# Patient Record
Sex: Female | Born: 1990 | Race: White | Hispanic: No | Marital: Single | Smoking: Never smoker
Health system: Southern US, Community
[De-identification: ages and names within clinical notes are randomized; demographics above are authoritative.]

---

## 2016-08-14 ENCOUNTER — Ambulatory Visit (INDEPENDENT_AMBULATORY_CARE_PROVIDER_SITE_OTHER): Payer: Managed Care, Other (non HMO) | Admitting: Family Medicine

## 2016-08-14 ENCOUNTER — Ambulatory Visit (INDEPENDENT_AMBULATORY_CARE_PROVIDER_SITE_OTHER): Payer: Managed Care, Other (non HMO)

## 2016-08-14 VITALS — BP 112/62 | HR 62 | Temp 98.4°F | Resp 18 | Ht 61.5 in | Wt 122.0 lb

## 2016-08-14 DIAGNOSIS — R0789 Other chest pain: Secondary | ICD-10-CM | POA: Diagnosis not present

## 2016-08-14 MED ORDER — RANITIDINE HCL 150 MG PO TABS
ORAL_TABLET | ORAL | 0 refills | Status: AC
Start: 2016-08-14 — End: ?

## 2016-08-14 NOTE — Patient Instructions (Addendum)
Start Ranitidine 150 mg once with lunch and at bedtime. Once symptom free for 5 days, discontinue use and only take if symptoms develop again.  Normal EKG. X-ray pending. Will notify you once image is read.  IF you received an x-ray today, you will receive an invoice from [facility]. Please contact [facility] at [phone] with questions or concerns regarding your invoice.   IF you received labwork today, you will receive an invoice from [facility]. Please contact [facility] at [phone] with questions or concerns regarding your invoice.   Our billing staff will not be able to assist you with questions regarding bills from these companies.  You will be contacted with the lab results as soon as they are available. The fastest way to get your results is to activate your My Chart account. Instructions are located on the last page of this paperwork. If you have not heard from us regarding the results in 2 weeks, please contact this office.     Heartburn Introduction Heartburn is a type of pain or discomfort that can happen in the throat or chest. It is often described as a burning pain. It may also cause a bad taste in the mouth. Heartburn may feel worse when you lie down or bend over. It may be caused by stomach contents that move back up (reflux) into the tube that connects the mouth with the stomach (esophagus). Follow these instructions at home: Take these actions to lessen your discomfort and to help avoid problems. Diet  Follow a diet as told by your doctor. You may need to avoid foods and drinks such as:  Coffee and tea (with or without caffeine).  Drinks that contain alcohol.  Energy drinks and sports drinks.  Carbonated drinks or sodas.  Chocolate and cocoa.  Peppermint and mint flavorings.  Garlic and onions.  Horseradish.  Spicy and acidic foods, such as peppers, chili powder, curry powder, vinegar, hot sauces, and BBQ sauce.  Citrus fruit juices  and citrus fruits, such as oranges, lemons, and limes.  Tomato-based foods, such as red sauce, chili, salsa, and pizza with red sauce.  Fried and fatty foods, such as donuts, french fries, potato chips, and high-fat dressings.  High-fat meats, such as hot dogs, rib eye steak, sausage, ham, and bacon.  High-fat dairy items, such as whole milk, butter, and cream cheese.  Eat small meals often. Avoid eating large meals.  Avoid drinking large amounts of liquid with your meals.  Avoid eating meals during the 2-3 hours before bedtime.  Avoid lying down right after you eat.  Do not exercise right after you eat. General instructions  Pay attention to any changes in your symptoms.  Take over-the-counter and prescription medicines only as told by your doctor. Do not take aspirin, ibuprofen, or other NSAIDs unless your doctor says it is okay.  Do not use any tobacco products, including cigarettes, chewing tobacco, and e-cigarettes. If you need help quitting, ask your doctor.  Wear loose clothes. Do not wear anything tight around your waist.  Raise (elevate) the head of your bed about 6 inches (15 cm).  Try to lower your stress. If you need help doing this, ask your doctor.  If you are overweight, lose an amount of weight that is healthy for you. Ask your doctor about a safe weight loss goal.  Keep all follow-up visits as told by your doctor. This is important. Contact a doctor if:  You have new symptoms.  You lose weight and you do  not know why it is happening.  You have trouble swallowing, or it hurts to swallow.  You have wheezing or a cough that keeps happening.  Your symptoms do not get better with treatment.  You have heartburn often for more than two weeks. Get help right away if:  You have pain in your arms, neck, jaw, teeth, or back.  You feel sweaty, dizzy, or light-headed.  You have chest pain or shortness of breath.  You throw up (vomit) and your throw up  looks like blood or coffee grounds.  Your poop (stool) is bloody or black. This information is not intended to replace advice given to you by your health care provider. Make sure you discuss any questions you have with your health care provider. Document Released: 02/24/2011 Document Revised: 11/20/2015 Document Reviewed: 10/09/2014  2017 Elsevier   Pleurisy Pleurisy is irritation and swelling (inflammation) of the linings of your lungs (pleura). This can cause pain in your chest, back, or shoulder. It can also cause trouble breathing. Follow these instructions at home: Medicines  Take over-the-counter and prescription medicines only as told by your doctor.  If you were prescribed antibiotic medicine, take it as told by your doctor. Do not stop taking the antibiotic even if you start to feel better. Activity  Rest and return to your normal activities as told by your doctor. Ask your doctor what activities are safe for you.  Do not drive or use heavy machinery while taking prescription pain medicine. General instructions  Watch for any changes in your condition.  Take deep breaths often, even if it is painful. This can help prevent lung problems.  When lying down, lie on your painful side. This may help you feel less pain.  Do not smoke. If you need help quitting, ask your doctor.  Keep all follow-up visits as told by your doctor. This is important. Contact a doctor if:  You have pain that:  Gets worse.  Does not get better with medicine.  Lasts for more than 1 week.  You have a fever or chills.  You have a cough that does not get better at home.  You have trouble breathing that does not get better at home.  You cough up liquid that looks like pus (purulent secretions). Get help right away if:  Your lips, fingernails, or toenails turn dark or turn blue.  You cough up blood.  You have trouble breathing that gets worse.  You are making loud noises when you  breathe (wheezing) and this gets worse.  You have pain that spreads to your neck, arms, or jaw.  You get a rash.  You throw up (vomit).  You pass out (faint). Summary  Pleurisy is irritation and swelling (inflammation) of the linings of your lungs (pleura).  Pleurisy can cause pain and trouble breathing.  If you have a cough that does not get better at home, contact your doctor.  Get help right away if you are having trouble breathing and it is getting worse. This information is not intended to replace advice given to you by your health care provider. Make sure you discuss any questions you have with your health care provider. Document Released: 05/27/2008 Document Revised: 03/08/2016 Document Reviewed: 03/08/2016 Elsevier Interactive Patient Education  2017 [employer].

## 2016-08-14 NOTE — Progress Notes (Signed)
Patient ID: Crystal Blankenship, female    DOB: 01-29-1991, 26 y.o.   MRN: [id number]  PCP: No primary care provider on file.  Chief Complaint  Patient presents with  . Chest Pain    Describes as burning similar to heart burn  . Back Pain    Upper back pain    Subjective:  HPI  26 year old presents for evaluation of chest pain (burning) which radiates to her upper back. 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  She reports intermittent symptoms of  lower-sternal pain and that radiates to mid back, lasting for approximately 20 seconds during a span of 1 year. Recently, symptoms have become more persistent. Characterizes pain as a burning to pressure sensation. Denies sharp or stabbing sensation. These episodes only occur in the afternoon and at night. To her knowledge, these episodes are not associated with food or fluid intake. She has not had any recent upper respiratory illness. Reports physical activity x 5 days per week with intense cardiovascular and strength training exercise. Denies dizziness, shortness of breath, light-headedness, with/without activity. Denies nausea or vomiting. Overall reports a healthy diet with occasional beer intake on the weekends only. No hx of acid reflux or abdominal complaints.  Social History   Social History  . Marital status: Single    Spouse name: N/A  . Number of children: N/A  . Years of education: N/A   Occupational History  . Not on file.   Social History Main Topics  . Smoking status: Never Smoker  . Smokeless tobacco: Never Used  . Alcohol use Not on file  . Drug use: Unknown  . Sexual activity: Not on file   Other Topics Concern  . Not on file   Social History Narrative  . No narrative on file    Family History  Problem Relation Age of Onset  . Cancer Maternal Grandmother    Review of Systems HPI  Prior to Admission medications   Not on File    Past Medical, Surgical Family and Social History  reviewed and updated.  Objective:   Today's Vitals   08/14/16 0823  BP: 112/62  Pulse: 62  Resp: 18  Temp: 98.4 F (36.9 C)  TempSrc: Oral  SpO2: 99%  Weight: 122 lb (55.3 kg)  Height: 5' 1.5" (1.562 m)    Wt Readings from Last 3 Encounters:  08/14/16 122 lb (55.3 kg)   Physical Exam  Constitutional: She is oriented to person, place, and time. She appears well-developed and well-nourished.  Cardiovascular: Normal rate, regular rhythm, normal heart sounds and intact distal pulses.   Sternal pain is non-reproducible and pt denies any chest pain or discomfort during visit or exam.  Pulmonary/Chest: Effort normal and breath sounds normal.  Musculoskeletal: Normal range of motion.  Neurological: She is alert and oriented to person, place, and time.  Skin: Skin is warm and dry.  Psychiatric: She has a normal mood and affect. Her behavior is normal. Judgment and thought content normal.   Dg Chest 2 View  Result Date: 08/14/2016 CLINICAL DATA:  Chest wall burning radiating to the back for several weeks. EXAM: CHEST  2 VIEW COMPARISON:  None. FINDINGS: Normal cardiac silhouette and mediastinal contours. Excellent inspiratory effort. No pleural effusion or pneumothorax. No evidence of edema. No acute osseus abnormalities. IMPRESSION: No acute cardiopulmonary disease. Electronically Signed   By: Simonne Come M.D.   On: 08/14/2016 10:24     Assessment & Plan:  1.  Chest wall discomfort - EKG 12-Lead-unremarkable  - DG Chest 2 View-unremarkable Plan: Treat with trial of acid suppressant for possible heartburn /acid reflux.  Treating short-term with ranitidine 150 mg at lunch and bedtime.  Once symptoms resolve discontinue use and only repeat if symptoms develop again.  Return for follow-up as needed.  Godfrey PickKimberly S. Tiburcio PeaHarris, MSN, FNP-C Primary Care at [facility] [phone]

## 2017-12-15 IMAGING — DX DG CHEST 2V
2 series · 2 of 2 positions shown · non-contrast
Comparison: None.

CLINICAL DATA: Chest wall burning radiating to the back for several
weeks.

EXAM:
CHEST  2 VIEW

[chest pa]
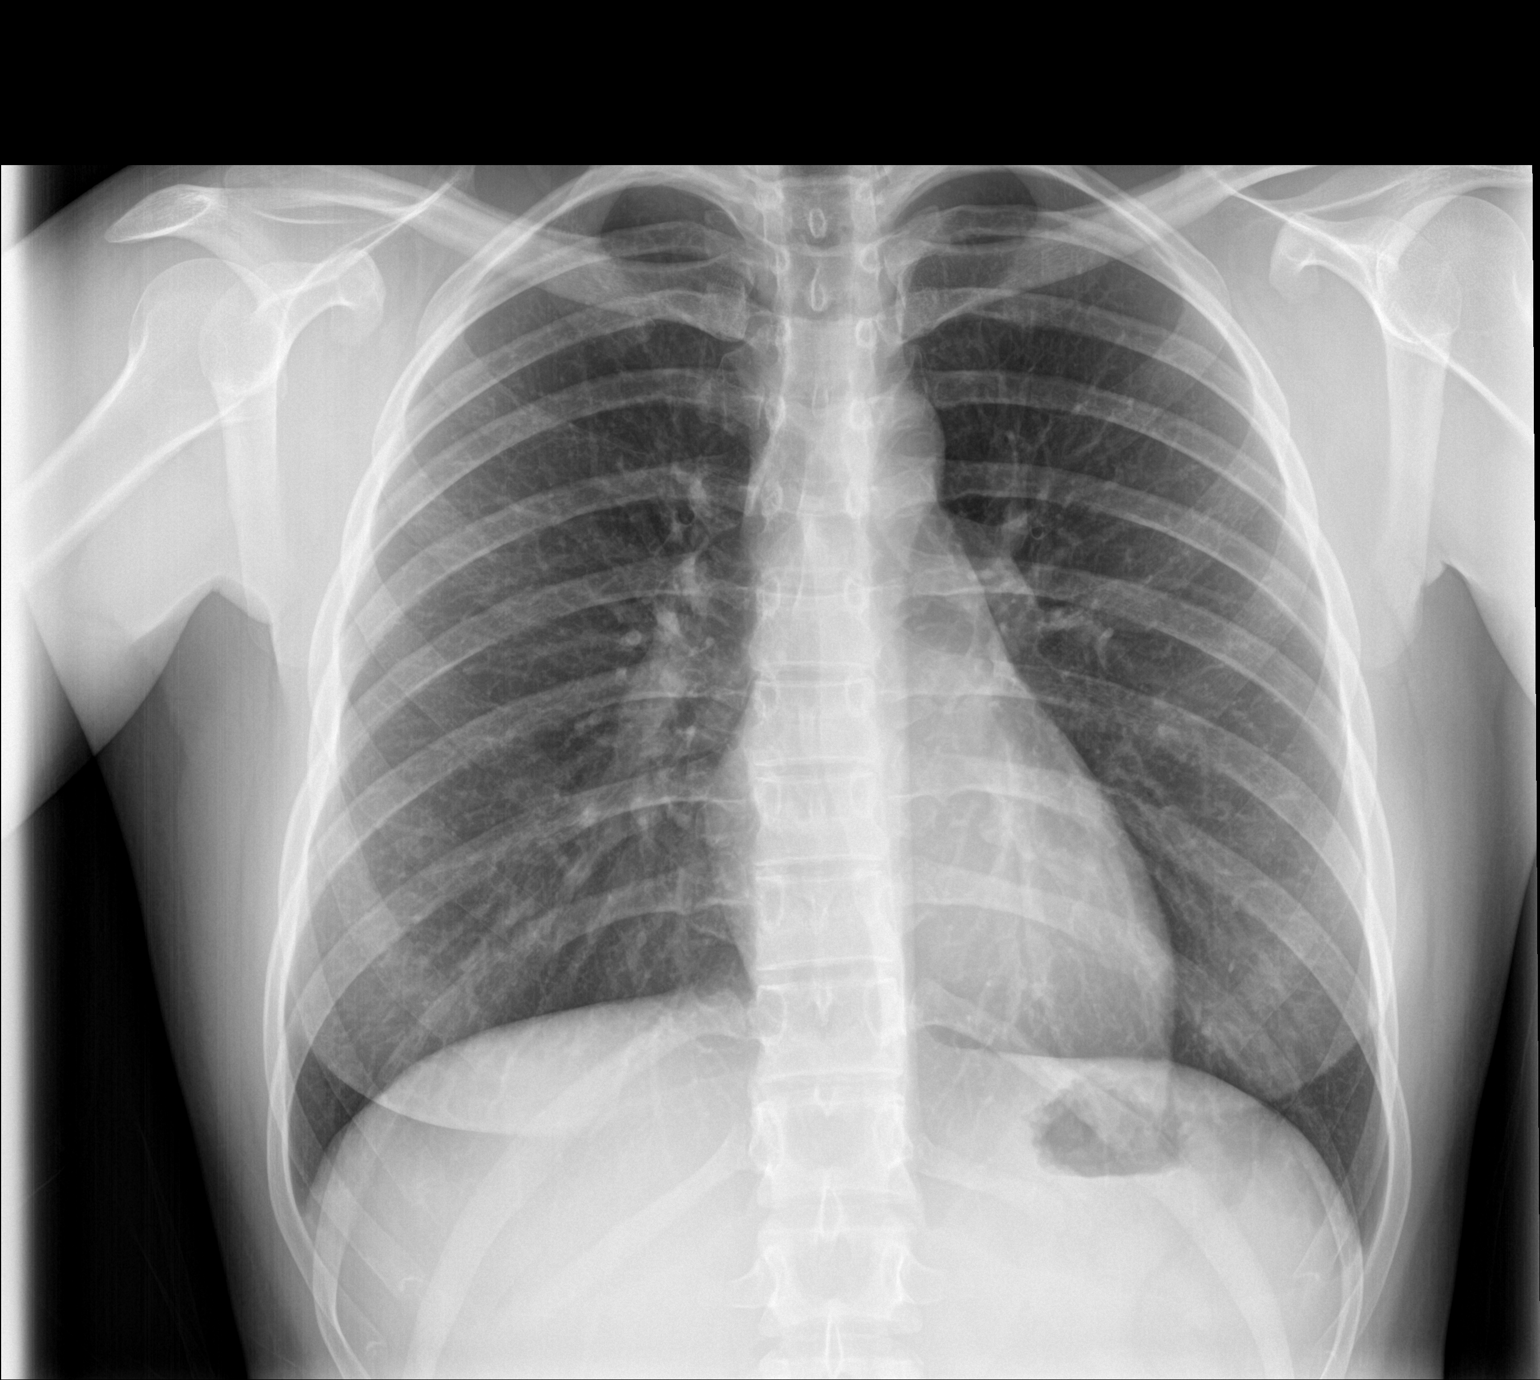

[chest lat]
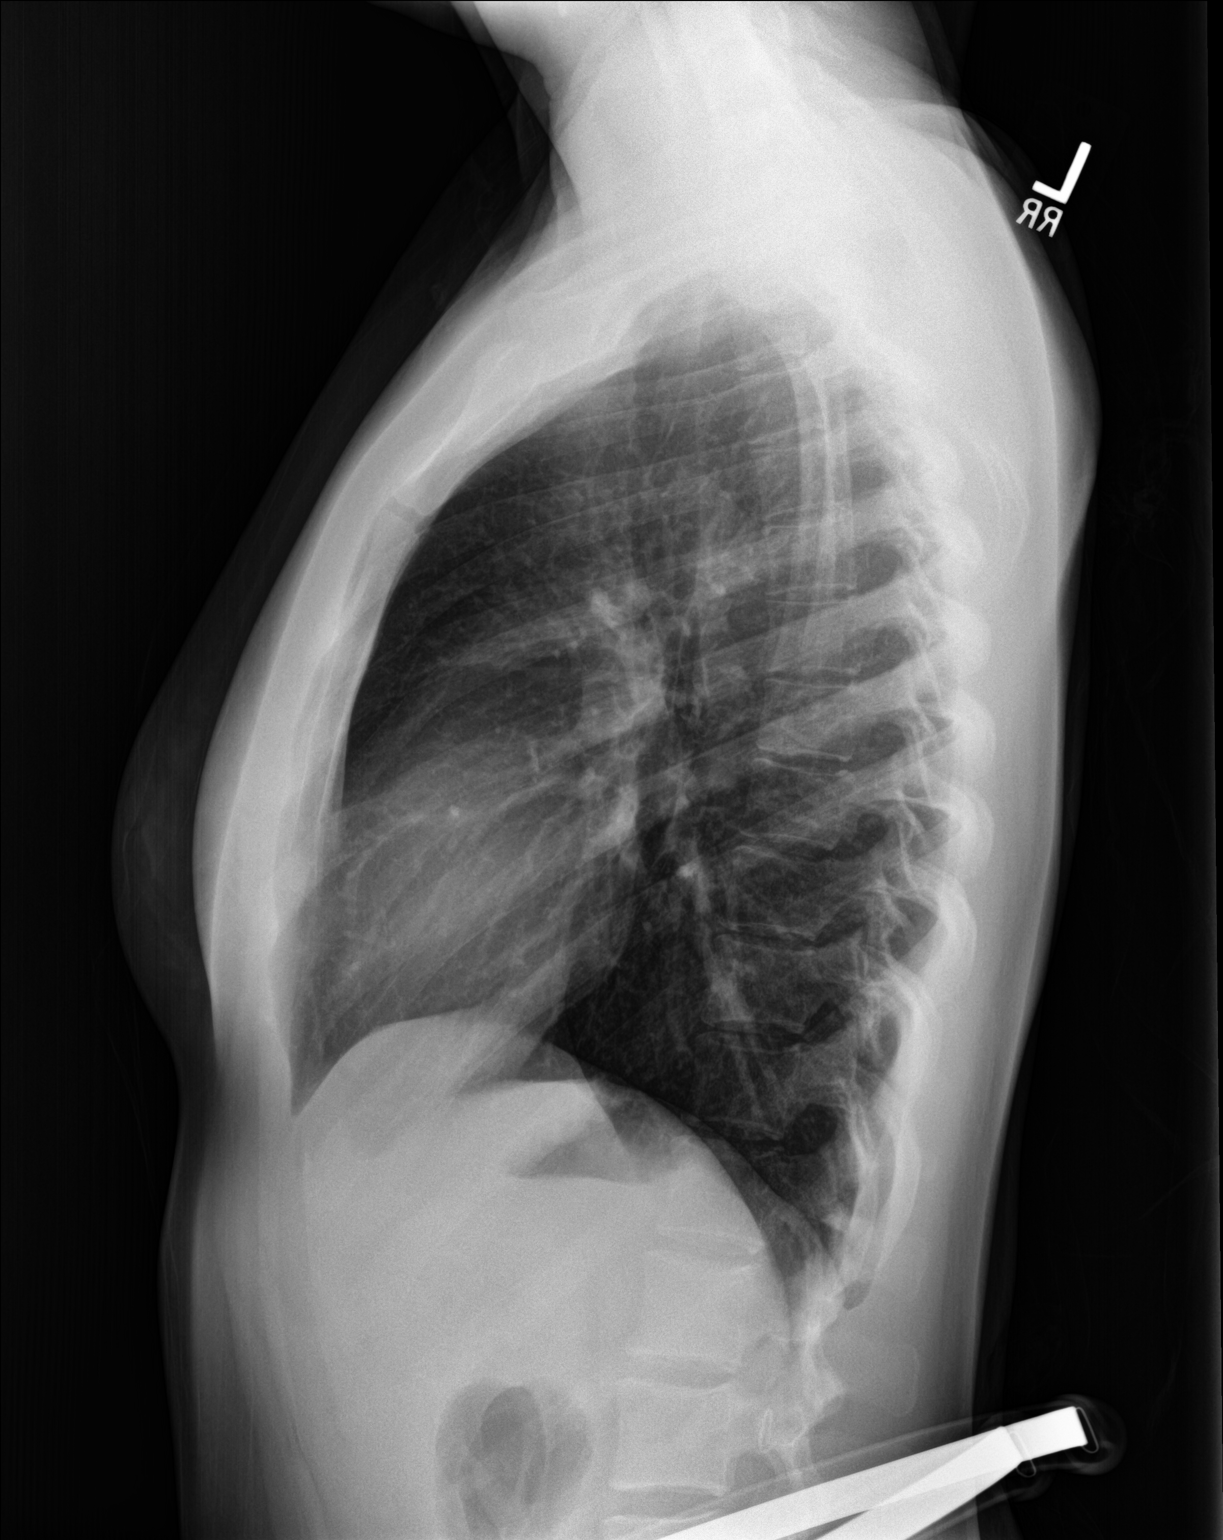

[2 of 2 positions shown; findings below may reference images not displayed]

FINDINGS: Normal cardiac silhouette and mediastinal contours. Excellent
inspiratory effort. No pleural effusion or pneumothorax. No evidence
of edema. No acute osseus abnormalities.
IMPRESSION: No acute cardiopulmonary disease.
